# Patient Record
Sex: Female | Born: 1940 | Race: White | Hispanic: No | Smoking: Never smoker
Health system: Southern US, Community
[De-identification: ages and names within clinical notes are randomized; demographics above are authoritative.]

## PROBLEM LIST (undated history)

## (undated) DIAGNOSIS — K219 Gastro-esophageal reflux disease without esophagitis: Secondary | ICD-10-CM

## (undated) DIAGNOSIS — A920 Chikungunya virus disease: Secondary | ICD-10-CM

## (undated) DIAGNOSIS — M810 Age-related osteoporosis without current pathological fracture: Secondary | ICD-10-CM

## (undated) DIAGNOSIS — G629 Polyneuropathy, unspecified: Secondary | ICD-10-CM

## (undated) DIAGNOSIS — I2510 Atherosclerotic heart disease of native coronary artery without angina pectoris: Secondary | ICD-10-CM

## (undated) DIAGNOSIS — Z8719 Personal history of other diseases of the digestive system: Secondary | ICD-10-CM

## (undated) DIAGNOSIS — I10 Essential (primary) hypertension: Secondary | ICD-10-CM

## (undated) DIAGNOSIS — R7302 Impaired glucose tolerance (oral): Secondary | ICD-10-CM

## (undated) DIAGNOSIS — E785 Hyperlipidemia, unspecified: Secondary | ICD-10-CM

## (undated) DIAGNOSIS — I809 Phlebitis and thrombophlebitis of unspecified site: Secondary | ICD-10-CM

## (undated) HISTORY — PX: ESOPHAGOGASTRODUODENOSCOPY: SHX1529

## (undated) HISTORY — PX: TONSILLECTOMY: SUR1361

## (undated) HISTORY — PX: COLONOSCOPY: SHX174

## (undated) HISTORY — PX: ABDOMINAL HYSTERECTOMY: SHX81

## (undated) HISTORY — PX: APPENDECTOMY: SHX54

## (undated) HISTORY — PX: HERNIA REPAIR: SHX51

## (undated) HISTORY — PX: EYE SURGERY: SHX253

---

## 2007-01-29 ENCOUNTER — Ambulatory Visit: Payer: Self-pay | Admitting: Nurse Practitioner

## 2007-07-10 ENCOUNTER — Ambulatory Visit: Payer: Self-pay | Admitting: Nurse Practitioner

## 2007-09-17 ENCOUNTER — Ambulatory Visit: Payer: Self-pay | Admitting: Gastroenterology

## 2008-04-03 ENCOUNTER — Ambulatory Visit: Payer: Self-pay | Admitting: Nurse Practitioner

## 2009-03-25 ENCOUNTER — Ambulatory Visit: Payer: Self-pay | Admitting: Nurse Practitioner

## 2009-04-09 ENCOUNTER — Ambulatory Visit: Payer: Self-pay | Admitting: Nurse Practitioner

## 2010-09-28 ENCOUNTER — Ambulatory Visit: Payer: Self-pay | Admitting: Family Medicine

## 2011-04-19 ENCOUNTER — Ambulatory Visit: Payer: Self-pay | Admitting: Gastroenterology

## 2011-04-29 ENCOUNTER — Ambulatory Visit: Payer: Self-pay | Admitting: Gastroenterology

## 2011-12-14 ENCOUNTER — Ambulatory Visit: Payer: Self-pay | Admitting: Family Medicine

## 2013-04-16 ENCOUNTER — Ambulatory Visit: Payer: Self-pay | Admitting: Nurse Practitioner

## 2014-04-22 ENCOUNTER — Ambulatory Visit: Payer: Self-pay | Admitting: Nurse Practitioner

## 2014-10-13 ENCOUNTER — Encounter: Payer: Self-pay | Admitting: *Deleted

## 2014-10-14 ENCOUNTER — Encounter: Payer: Self-pay | Admitting: *Deleted

## 2014-10-14 ENCOUNTER — Ambulatory Visit
Admission: RE | Admit: 2014-10-14 | Discharge: 2014-10-14 | Disposition: A | Discharge status: Home / Self Care | Payer: Medicare Other | Source: Ambulatory Visit | Attending: Gastroenterology | Admitting: Gastroenterology

## 2014-10-14 ENCOUNTER — Encounter
Admission: RE | Disposition: A | Discharge status: Home / Self Care | Payer: Self-pay | Source: Ambulatory Visit | Attending: Gastroenterology

## 2014-10-14 ENCOUNTER — Ambulatory Visit: Payer: Medicare Other | Admitting: Anesthesiology

## 2014-10-14 ENCOUNTER — Other Ambulatory Visit: Payer: Self-pay | Admitting: Gastroenterology

## 2014-10-14 DIAGNOSIS — Z7982 Long term (current) use of aspirin: Secondary | ICD-10-CM | POA: Diagnosis not present

## 2014-10-14 DIAGNOSIS — I2510 Atherosclerotic heart disease of native coronary artery without angina pectoris: Secondary | ICD-10-CM | POA: Insufficient documentation

## 2014-10-14 DIAGNOSIS — E785 Hyperlipidemia, unspecified: Secondary | ICD-10-CM | POA: Diagnosis not present

## 2014-10-14 DIAGNOSIS — Z09 Encounter for follow-up examination after completed treatment for conditions other than malignant neoplasm: Secondary | ICD-10-CM | POA: Diagnosis present

## 2014-10-14 DIAGNOSIS — K5730 Diverticulosis of large intestine without perforation or abscess without bleeding: Secondary | ICD-10-CM | POA: Diagnosis not present

## 2014-10-14 DIAGNOSIS — Z86010 Personal history of colonic polyps: Secondary | ICD-10-CM | POA: Insufficient documentation

## 2014-10-14 DIAGNOSIS — I10 Essential (primary) hypertension: Secondary | ICD-10-CM | POA: Diagnosis not present

## 2014-10-14 DIAGNOSIS — M810 Age-related osteoporosis without current pathological fracture: Secondary | ICD-10-CM | POA: Insufficient documentation

## 2014-10-14 DIAGNOSIS — K6389 Other specified diseases of intestine: Secondary | ICD-10-CM | POA: Insufficient documentation

## 2014-10-14 DIAGNOSIS — G629 Polyneuropathy, unspecified: Secondary | ICD-10-CM | POA: Diagnosis not present

## 2014-10-14 DIAGNOSIS — R7302 Impaired glucose tolerance (oral): Secondary | ICD-10-CM | POA: Insufficient documentation

## 2014-10-14 DIAGNOSIS — K449 Diaphragmatic hernia without obstruction or gangrene: Secondary | ICD-10-CM | POA: Diagnosis not present

## 2014-10-14 DIAGNOSIS — K219 Gastro-esophageal reflux disease without esophagitis: Secondary | ICD-10-CM | POA: Insufficient documentation

## 2014-10-14 HISTORY — PX: COLONOSCOPY WITH PROPOFOL: SHX5780

## 2014-10-14 HISTORY — DX: Polyneuropathy, unspecified: G62.9

## 2014-10-14 HISTORY — DX: Essential (primary) hypertension: I10

## 2014-10-14 HISTORY — DX: Gastro-esophageal reflux disease without esophagitis: K21.9

## 2014-10-14 HISTORY — DX: Age-related osteoporosis without current pathological fracture: M81.0

## 2014-10-14 HISTORY — DX: Impaired glucose tolerance (oral): R73.02

## 2014-10-14 HISTORY — DX: Chikungunya virus disease: A92.0

## 2014-10-14 HISTORY — DX: Atherosclerotic heart disease of native coronary artery without angina pectoris: I25.10

## 2014-10-14 HISTORY — DX: Hyperlipidemia, unspecified: E78.5

## 2014-10-14 HISTORY — DX: Personal history of other diseases of the digestive system: Z87.19

## 2014-10-14 HISTORY — DX: Phlebitis and thrombophlebitis of unspecified site: I80.9

## 2014-10-14 SURGERY — COLONOSCOPY WITH PROPOFOL
Anesthesia: General

## 2014-10-14 MED ORDER — SODIUM CHLORIDE 0.9 % IV SOLN: INTRAVENOUS | Status: DC

## 2014-10-14 MED ORDER — SODIUM CHLORIDE 0.9 % IV SOLN
INTRAVENOUS | Status: DC
  Administered 2014-10-14: 1000 mL via INTRAVENOUS

## 2014-10-14 MED ORDER — PROPOFOL INFUSION 10 MG/ML OPTIME
INTRAVENOUS | Status: DC | PRN
  Administered 2014-10-14: 150 ug/kg/min via INTRAVENOUS

## 2014-10-14 MED ORDER — LIDOCAINE HCL (CARDIAC) 20 MG/ML IV SOLN
INTRAVENOUS | Status: DC | PRN
  Administered 2014-10-14: 40 mg via INTRAVENOUS

## 2014-10-14 MED ORDER — EPHEDRINE SULFATE 50 MG/ML IJ SOLN
INTRAMUSCULAR | Status: DC | PRN
  Administered 2014-10-14: 5 mg via INTRAVENOUS

## 2014-10-14 MED ORDER — GLYCOPYRROLATE 0.2 MG/ML IJ SOLN
INTRAMUSCULAR | Status: DC | PRN
  Administered 2014-10-14 (×2): 0.1 mg via INTRAVENOUS

## 2014-10-14 NOTE — OR Nursing (Signed)
Incomplete colonoscopy, only got to sigmoid colon.

## 2014-10-14 NOTE — Transfer of Care (Signed)
Immediate Anesthesia Transfer of Care Note  Patient: Carmen Dawson  Procedure(s) Performed: Procedure(s): COLONOSCOPY WITH PROPOFOL (N/A)  Patient Location: PACU  Anesthesia Type:General  Level of Consciousness: alert  and oriented  Airway & Oxygen Therapy: Patient Spontanous Breathing  Post-op Assessment: Report given to RN  Post vital signs: stable  Last Vitals:  Filed Vitals:   10/14/14 0737  BP: 153/80  Pulse: 84  Temp: 36.4 C  Resp: 20    Complications: No apparent anesthesia complications

## 2014-10-14 NOTE — H&P (Signed)
Outpatient short stay form Pre-procedure 10/14/2014 8:09 AM Christena Deem MD  Primary Physician: Alex Gardener  Reason for visit:  Colonoscopy  History of present illness:  Patient is 74 year old female presenting today for colonoscopy in regards to her personal history of colon polyps. She tolerated her prep well. She has not taken her 81 mg aspirin for a couple of days. She takes no other aspirin or anticoagulation products.    Current facility-administered medications:  .  0.9 %  sodium chloride infusion, , Intravenous, Continuous, Christena Deem, MD, Last Rate: 20 mL/hr at 10/14/14 0750, 1,000 mL at 10/14/14 0750 .  0.9 %  sodium chloride infusion, , Intravenous, Continuous, Christena Deem, MD  Prescriptions prior to admission  Medication Sig Dispense Refill Last Dose  . aspirin 81 MG tablet Take 81 mg by mouth daily.     Marland Kitchen atorvastatin (LIPITOR) 40 MG tablet Take 40 mg by mouth daily.     . calcium carbonate (OS-CAL) 600 MG TABS tablet Take 600 mg by mouth 2 (two) times daily with a meal.     . geriatric multivitamins-minerals (ELDERTONIC/GEVRABON) ELIX Take 15 mLs by mouth daily.     Marland Kitchen triamterene-hydrochlorothiazide (MAXZIDE-25) 37.5-25 MG per tablet Take 1 tablet by mouth daily.   10/14/2014 at 0615     No Known Allergies   Past Medical History  Diagnosis Date  . Coronary artery disease   . Hypertension   . Hyperlipidemia   . GERD (gastroesophageal reflux disease)   . History of hiatal hernia   . Osteoporosis   . Phlebitis   . Peripheral neuropathy   . Impaired glucose tolerance   . Chikungunya virus disease     Review of systems:      Physical Exam    Heart and lungs: Regular rate and rhythm without rub or gallop, lungs are bilaterally clear    HEENT: Normal cephalic atraumatic eyes are anicteric    Other:     Pertinant exam for procedure: Soft nontender nondistended bowel sounds positive normoactive. There is just a little bit of discomfort to  the left of the umbilicus and palpation. No rebound.    Planned proceedures: Colonoscopy and indicated procedures. I have discussed the risks benefits and complications of procedures to include not limited to bleeding, infection, perforation and the risk of sedation and the patient wishes to proceed.    Christena Deem, MD Gastroenterology 10/14/2014  8:09 AM

## 2014-10-14 NOTE — Anesthesia Postprocedure Evaluation (Signed)
  Anesthesia Post-op Note  Patient: Carmen Dawson  Procedure(s) Performed: Procedure(s): COLONOSCOPY WITH PROPOFOL (N/A)  Anesthesia type:General  Patient location: PACU  Post pain: Pain level controlled  Post assessment: Post-op Vital signs reviewed, Patient's Cardiovascular Status Stable, Respiratory Function Stable, Patent Airway and No signs of Nausea or vomiting  Post vital signs: Reviewed and stable  Last Vitals:  Filed Vitals:   10/14/14 0905  BP: 117/73  Pulse: 85  Temp:   Resp: 15    Level of consciousness: awake, alert  and patient cooperative  Complications: No apparent anesthesia complications

## 2014-10-14 NOTE — Op Note (Signed)
[facility] Gastroenterology Patient Name: Carmen Dawson Procedure Date: 10/14/2014 8:10 AM MRN: [id number] Account #: [id number] Date of Birth: 15-Nov-1940 Admit Type: Outpatient Age: 74 Room: [facility] ENDO ROOM 3 Gender: Female Note Status: Finalized Procedure:         Colonoscopy Indications:       Personal history of colonic polyps Providers:         Christena Deem, MD Referring MD:      Caryl Asp (Referring MD) Medicines:         Monitored Anesthesia Care Complications:     No immediate complications. Procedure:         Pre-Anesthesia Assessment:                    - ASA Grade Assessment: II - A patient with mild systemic                     disease.                    After obtaining informed consent, the colonoscope was                     passed under direct vision. Throughout the procedure, the                     patient's blood pressure, pulse, and oxygen saturations                     were monitored continuously. The Olympus PCF-H180AL                     colonoscope ( S#: [id number] ) was introduced through the                     anus with the intention of advancing to the cecum. The                     scope was advanced to the descending colon before the                     procedure was aborted. Medications were given. The                     colonoscopy was extremely difficult due to restricted                     mobility of the colon and a tortuous colon. The patient                     tolerated the procedure well. The quality of the bowel                     preparation was good. Findings:      The sigmoid colon was significantly tortuous. The scope was advanced to       the upper sigmoid region, where a turn was encountered that could not be       opened or passed despite positon changes and scope change.      Multiple small-mouthed diverticula were found in the sigmoid colon.      The digital rectal exam was normal. Impression:        -  Tortuous colon.                    -  Diverticulosis in the sigmoid colon.                    - No specimens collected. Recommendation:    - Discharge patient to home. Procedure Code(s): --- Professional ---                    [id number], Sigmoidoscopy, flexible; diagnostic, including                     collection of specimen(s) by brushing or washing, when                     performed (separate procedure) Diagnosis Code(s): --- Professional ---                    V12.72, Personal history of colonic polyps                    562.10, Diverticulosis of colon (without mention of                     hemorrhage)                    751.5, Other anomalies of intestine CPT copyright 2014 [employer]. All rights reserved. The codes documented in this report are preliminary and upon coder review may  be revised to meet current compliance requirements. Christena Deem, MD 10/14/2014 8:58:06 AM This report has been signed electronically. Number of Addenda: 0 Note Initiated On: 10/14/2014 8:10 AM Total Procedure Duration: 0 hours 29 minutes 39 seconds       [facility]

## 2014-10-14 NOTE — Anesthesia Preprocedure Evaluation (Signed)
Anesthesia Evaluation  Patient identified by MRN, date of birth, ID band Patient confused    Reviewed: Allergy & Precautions, NPO status , Patient's Chart, lab work & pertinent test results  Airway Mallampati: III       Dental no notable dental hx.    Pulmonary neg pulmonary ROS,    Pulmonary exam normal       Cardiovascular hypertension, Pt. on medications Normal cardiovascular exam    Neuro/Psych    GI/Hepatic Neg liver ROS, hiatal hernia, GERD-  ,  Endo/Other  negative endocrine ROS  Renal/GU negative Renal ROS     Musculoskeletal negative musculoskeletal ROS (+)   Abdominal   Peds  Hematology negative hematology ROS (+)   Anesthesia Other Findings   Reproductive/Obstetrics negative OB ROS                             Anesthesia Physical Anesthesia Plan  ASA: II  Anesthesia Plan: General   Post-op Pain Management:    Induction: Intravenous  Airway Management Planned: Nasal Cannula  Additional Equipment:   Intra-op Plan:   Post-operative Plan:   Informed Consent: I have reviewed the patients History and Physical, chart, labs and discussed the procedure including the risks, benefits and alternatives for the proposed anesthesia with the patient or authorized representative who has indicated his/her understanding and acceptance.     Plan Discussed with: CRNA  Anesthesia Plan Comments:         Anesthesia Quick Evaluation

## 2014-10-14 NOTE — Anesthesia Postprocedure Evaluation (Signed)
  Anesthesia Post-op Note  Patient: Carmen Dawson  Procedure(s) Performed: Procedure(s): COLONOSCOPY WITH PROPOFOL (N/A)  Anesthesia type:General  Patient location: PACU  Post pain: Pain level controlled  Post assessment: Post-op Vital signs reviewed, Patient's Cardiovascular Status Stable, Respiratory Function Stable, Patent Airway and No signs of Nausea or vomiting  Post vital signs: Reviewed and stable  Last Vitals:  Filed Vitals:   10/14/14 0737  BP: 153/80  Pulse: 84  Temp: 36.4 C  Resp: 20    Level of consciousness: awake, alert  and patient cooperative  Complications: No apparent anesthesia complications

## 2014-10-24 ENCOUNTER — Ambulatory Visit
Admission: RE | Admit: 2014-10-24 | Discharge: 2014-10-24 | Disposition: A | Discharge status: Home / Self Care | Payer: Medicare Other | Source: Ambulatory Visit | Attending: Gastroenterology | Admitting: Gastroenterology

## 2014-10-24 DIAGNOSIS — K5730 Diverticulosis of large intestine without perforation or abscess without bleeding: Secondary | ICD-10-CM | POA: Insufficient documentation

## 2014-10-24 DIAGNOSIS — Z86010 Personal history of colonic polyps: Secondary | ICD-10-CM | POA: Insufficient documentation

## 2015-11-04 ENCOUNTER — Other Ambulatory Visit: Payer: Self-pay | Admitting: Nurse Practitioner

## 2015-11-04 DIAGNOSIS — Z1231 Encounter for screening mammogram for malignant neoplasm of breast: Secondary | ICD-10-CM

## 2015-11-12 ENCOUNTER — Ambulatory Visit
Admission: RE | Admit: 2015-11-12 | Discharge: 2015-11-12 | Disposition: A | Discharge status: Home / Self Care | Payer: Medicare Other | Source: Ambulatory Visit | Attending: Nurse Practitioner | Admitting: Nurse Practitioner

## 2015-11-12 ENCOUNTER — Encounter: Payer: Self-pay | Admitting: Radiology

## 2015-11-12 DIAGNOSIS — Z1231 Encounter for screening mammogram for malignant neoplasm of breast: Secondary | ICD-10-CM

## 2016-10-27 IMAGING — RF DG BE W/ AIR HIGH DENSITY
2 series · 7 of 10 positions shown · non-contrast
Comparison: None.

CLINICAL DATA: Incomplete colonoscopy.  History of polyps.

EXAM:
AIR CONTRAST BARIUM ENEMA
TECHNIQUE: Initial scout AP supine abdominal image obtained to insure adequate
colon cleansing. Barium was introduced into the colon in a
retrograde fashion and refluxed from the rectum to the distal
transverse colon. As much of the barium as possible was then removed
through the indwelling tube via gravity drain. Air was then
insufflated into the colon. Spot images of the colon followed by
overhead radiographs were obtained.
FLUOROSCOPY TIME:  Radiation Exposure Index (as provided by the
fluoroscopic device): 18.4 mGy

[Series 3: cp_standard · 0.26mm/px · 5 of 10 slices shown]
[im 1/10]
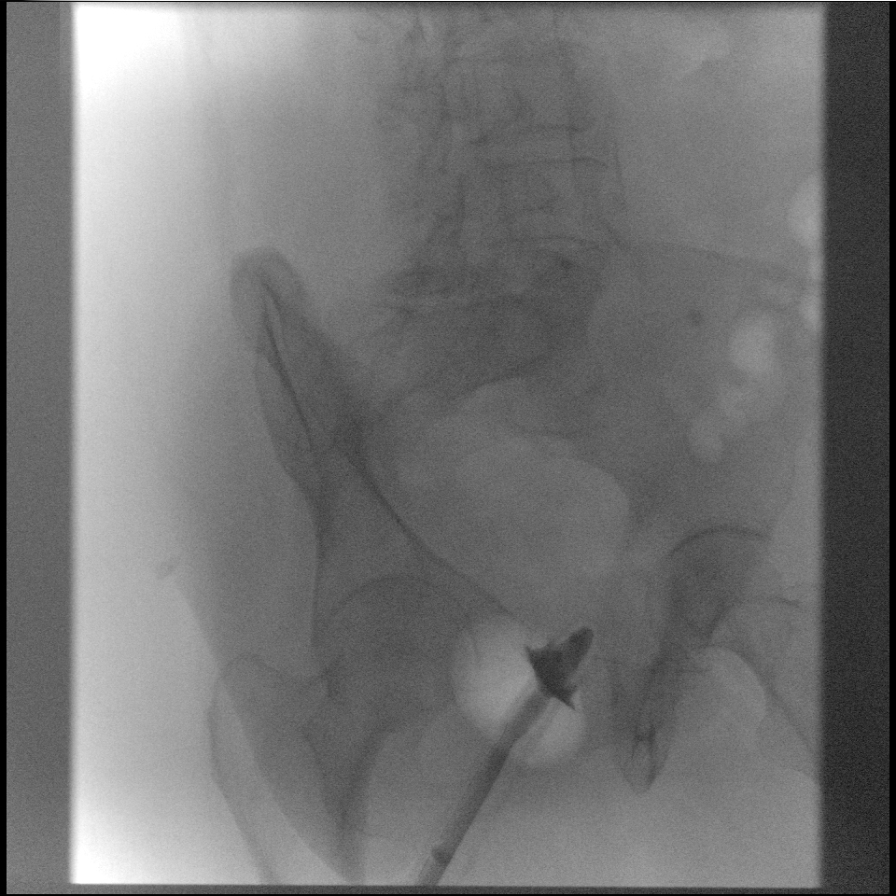
[im 3/10]
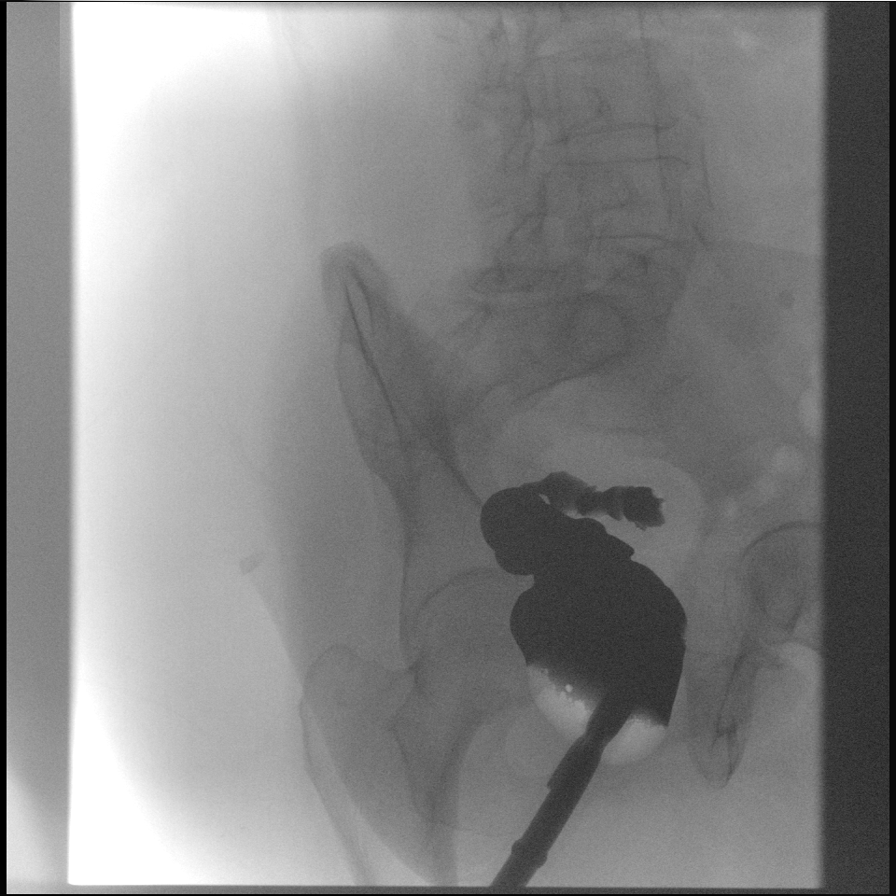
[im 5/10]
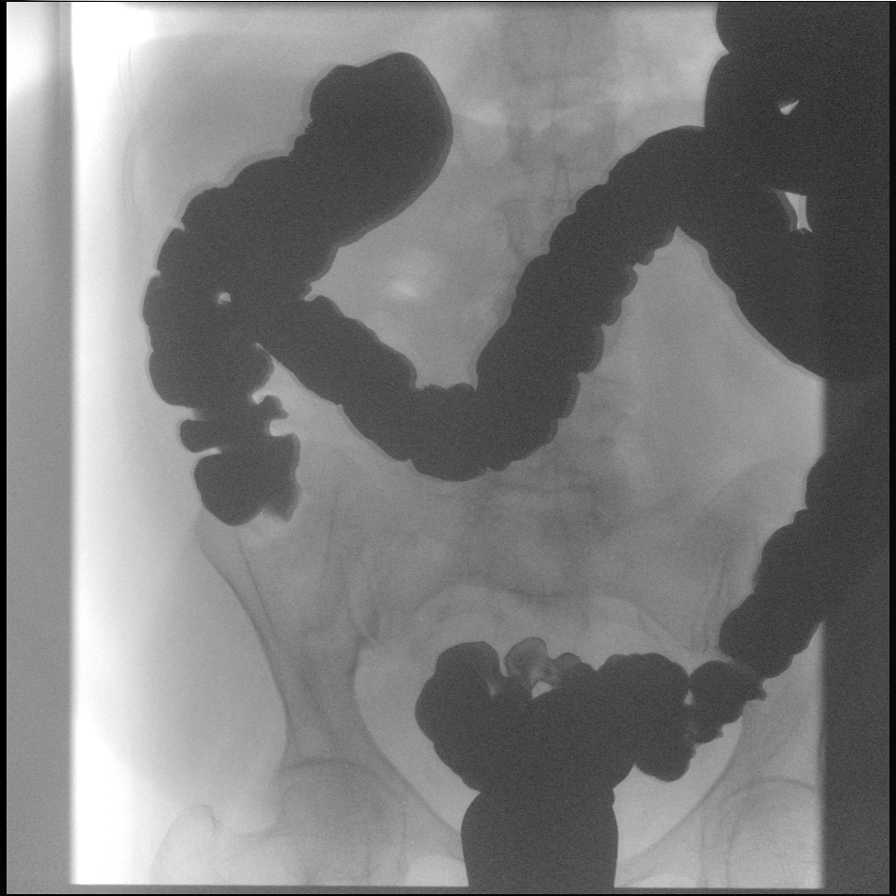
[im 7/10]
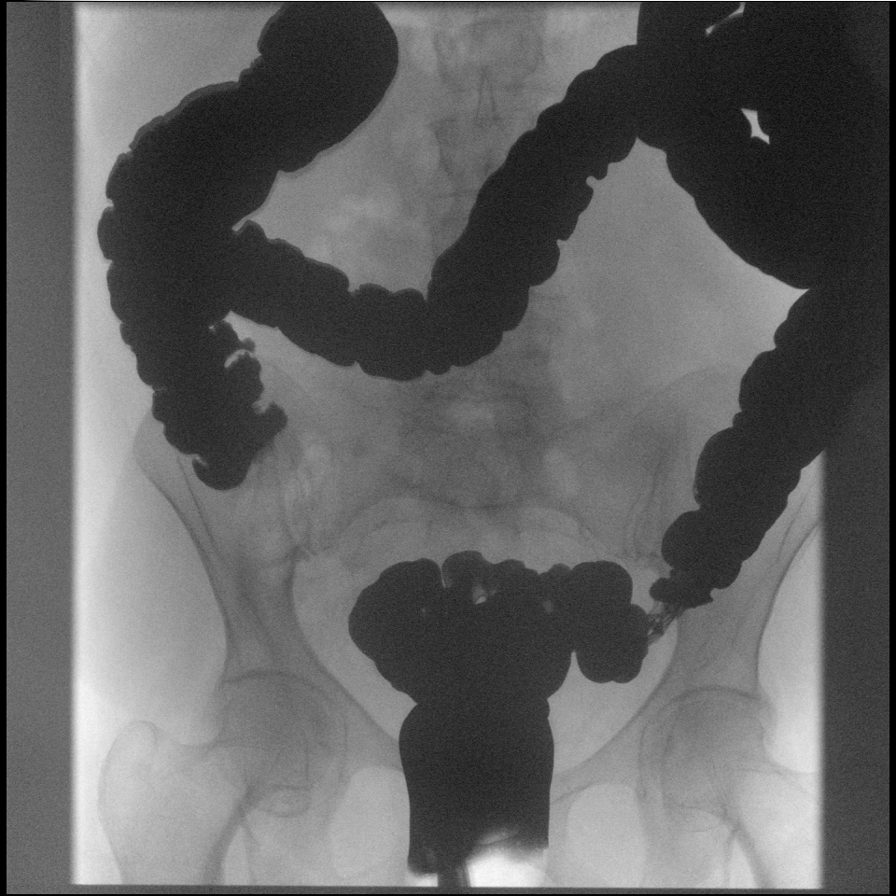
[im 10/10]
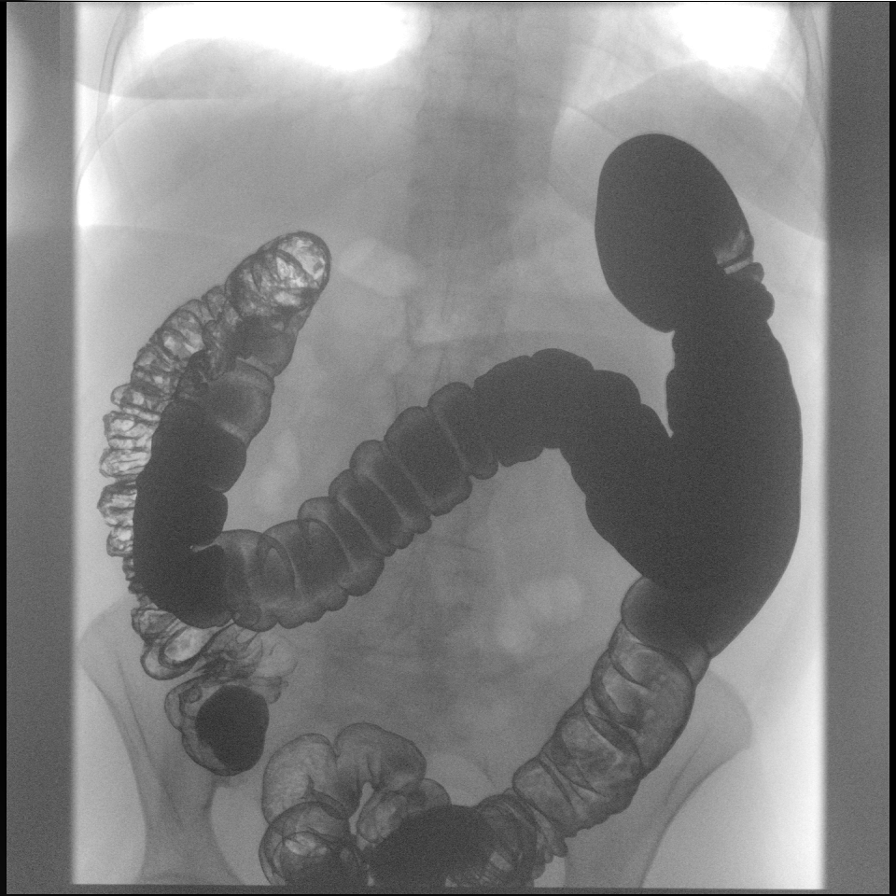

[Series 13: t abdomen barium ap · 0.14mm/px · 2 of 11 slices shown]
[im 1/11]
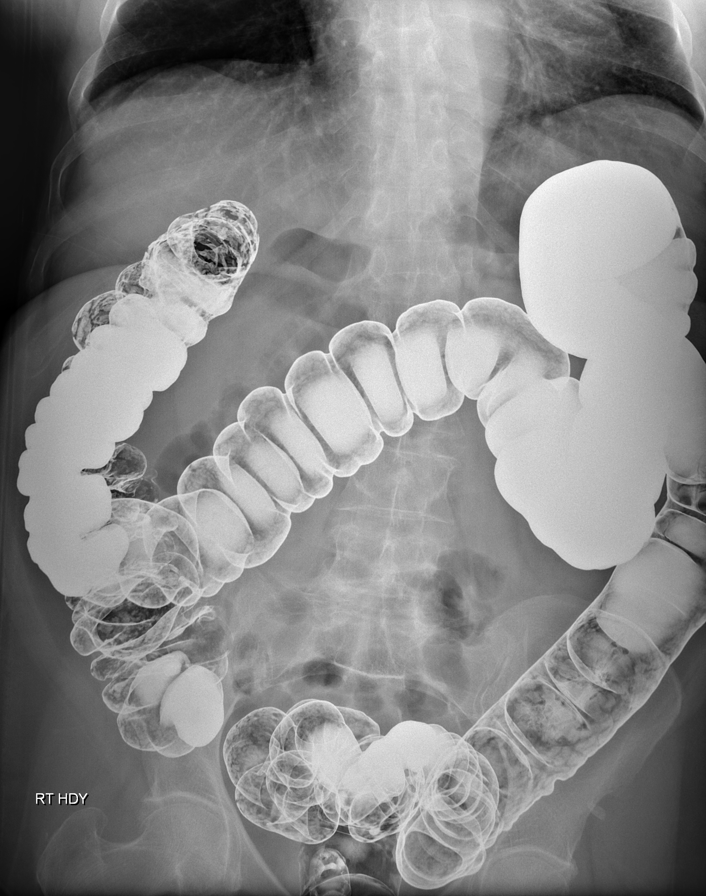
[im 3/11]
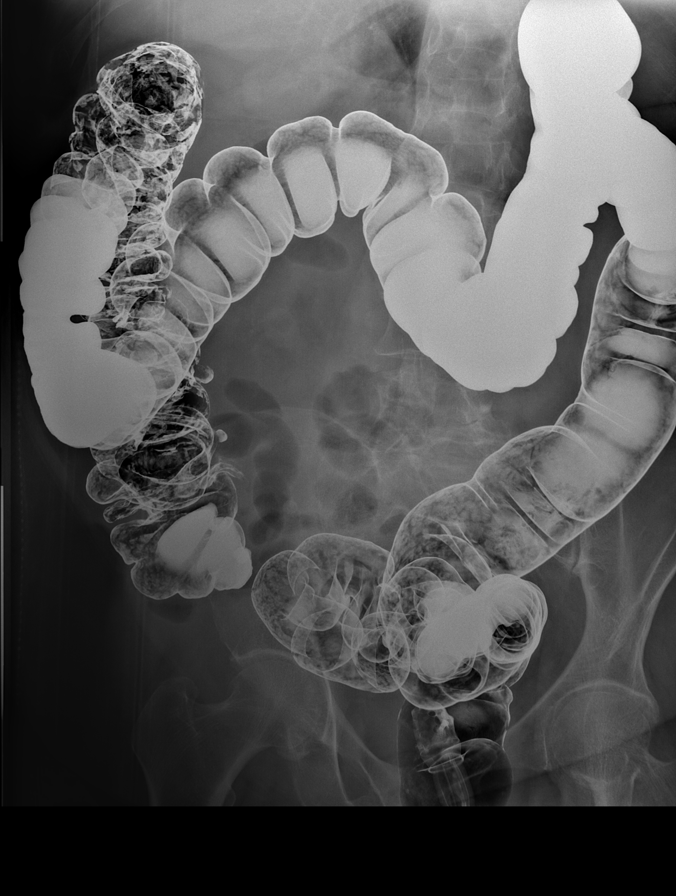

[7 of 10 positions shown; findings below may reference images not displayed]

FINDINGS: An enema catheter was inserted and retention balloon distended under
fluoroscopy. Barium followed by air was then introduced with
satisfactory demonstration of the colon.

Slightly limited examination secondary to poor colonic coating of
barium involving the ascending and proximal transverse colon.

Examination was negative for filling defects to indicate the
presence of a polyp or mass lesion. There was no evidence of colonic
stricture or obstruction. The mucosal pattern was normal. No
tethering, inflammatory changes, or definite ulcerations identified.
There are scattered uncomplicated diverticula of the sigmoid colon.
IMPRESSION: 1. Air contrast barium enema was negative for filling defects to
indicate presence of polyps or mass lesion.
